# Patient Record
Sex: Male | Born: 1965 | Race: Black or African American | Hispanic: No | Marital: Single | Smoking: Current every day smoker
Health system: Southern US, Community
[De-identification: ages and names within clinical notes are randomized; demographics above are authoritative.]

## PROBLEM LIST (undated history)

## (undated) DIAGNOSIS — K259 Gastric ulcer, unspecified as acute or chronic, without hemorrhage or perforation: Secondary | ICD-10-CM

## (undated) DIAGNOSIS — F1010 Alcohol abuse, uncomplicated: Secondary | ICD-10-CM

---

## 1997-07-20 ENCOUNTER — Emergency Department (HOSPITAL_COMMUNITY): Admission: EM | Admit: 1997-07-20 | Discharge: 1997-07-20 | Payer: Self-pay

## 1997-09-19 ENCOUNTER — Emergency Department (HOSPITAL_COMMUNITY): Admission: EM | Admit: 1997-09-19 | Discharge: 1997-09-19 | Payer: Self-pay | Admitting: *Deleted

## 1997-10-06 ENCOUNTER — Emergency Department (HOSPITAL_COMMUNITY): Admission: EM | Admit: 1997-10-06 | Discharge: 1997-10-06 | Payer: Self-pay | Admitting: Emergency Medicine

## 1998-02-02 ENCOUNTER — Emergency Department (HOSPITAL_COMMUNITY): Admission: EM | Admit: 1998-02-02 | Discharge: 1998-02-02 | Payer: Self-pay | Admitting: Emergency Medicine

## 1998-04-07 ENCOUNTER — Encounter: Payer: Self-pay | Admitting: Emergency Medicine

## 1998-04-07 ENCOUNTER — Emergency Department (HOSPITAL_COMMUNITY): Admission: EM | Admit: 1998-04-07 | Discharge: 1998-04-07 | Payer: Self-pay | Admitting: Emergency Medicine

## 1998-05-19 ENCOUNTER — Emergency Department (HOSPITAL_COMMUNITY): Admission: EM | Admit: 1998-05-19 | Discharge: 1998-05-19 | Payer: Self-pay | Admitting: Emergency Medicine

## 1998-05-30 ENCOUNTER — Encounter: Admission: RE | Admit: 1998-05-30 | Discharge: 1998-05-30 | Payer: Self-pay | Admitting: Family Medicine

## 1998-06-21 ENCOUNTER — Encounter: Admission: RE | Admit: 1998-06-21 | Discharge: 1998-06-21 | Payer: Self-pay | Admitting: Sports Medicine

## 1998-08-12 ENCOUNTER — Encounter: Admission: RE | Admit: 1998-08-12 | Discharge: 1998-08-12 | Payer: Self-pay | Admitting: Family Medicine

## 1998-08-15 ENCOUNTER — Encounter: Admission: RE | Admit: 1998-08-15 | Discharge: 1998-09-02 | Payer: Self-pay

## 1998-09-21 ENCOUNTER — Encounter: Admission: RE | Admit: 1998-09-21 | Discharge: 1998-09-21 | Payer: Self-pay | Admitting: Family Medicine

## 1998-11-28 ENCOUNTER — Encounter: Admission: RE | Admit: 1998-11-28 | Discharge: 1998-11-28 | Payer: Self-pay | Admitting: Family Medicine

## 1999-03-14 ENCOUNTER — Emergency Department (HOSPITAL_COMMUNITY): Admission: EM | Admit: 1999-03-14 | Discharge: 1999-03-14 | Payer: Self-pay | Admitting: Emergency Medicine

## 1999-03-20 ENCOUNTER — Encounter: Admission: RE | Admit: 1999-03-20 | Discharge: 1999-03-20 | Payer: Self-pay | Admitting: Internal Medicine

## 1999-04-06 ENCOUNTER — Emergency Department (HOSPITAL_COMMUNITY): Admission: EM | Admit: 1999-04-06 | Discharge: 1999-04-06 | Payer: Self-pay | Admitting: Emergency Medicine

## 1999-08-09 ENCOUNTER — Encounter: Payer: Self-pay | Admitting: Emergency Medicine

## 1999-08-09 ENCOUNTER — Emergency Department (HOSPITAL_COMMUNITY): Admission: EM | Admit: 1999-08-09 | Discharge: 1999-08-09 | Payer: Self-pay | Admitting: Emergency Medicine

## 2002-08-08 ENCOUNTER — Emergency Department (HOSPITAL_COMMUNITY): Admission: EM | Admit: 2002-08-08 | Discharge: 2002-08-08 | Payer: Self-pay | Admitting: Emergency Medicine

## 2002-08-08 ENCOUNTER — Encounter: Payer: Self-pay | Admitting: Emergency Medicine

## 2003-06-01 ENCOUNTER — Inpatient Hospital Stay (HOSPITAL_COMMUNITY): Admission: EM | Admit: 2003-06-01 | Discharge: 2003-06-02 | Payer: Self-pay | Admitting: Emergency Medicine

## 2003-06-01 ENCOUNTER — Encounter: Payer: Self-pay | Admitting: Cardiology

## 2007-04-17 ENCOUNTER — Emergency Department (HOSPITAL_COMMUNITY): Admission: EM | Admit: 2007-04-17 | Discharge: 2007-04-17 | Payer: Self-pay | Admitting: Emergency Medicine

## 2007-06-03 ENCOUNTER — Emergency Department (HOSPITAL_COMMUNITY): Admission: EM | Admit: 2007-06-03 | Discharge: 2007-06-03 | Payer: Self-pay | Admitting: Emergency Medicine

## 2009-03-04 IMAGING — CR DG CHEST 2V
2 series · 2 of 2 positions shown · non-contrast
Comparison: 06/01/03.

CLINICAL DATA: Hypertension/chest pain.  
 CHEST ? 2 VIEW:

[w chest pa *]
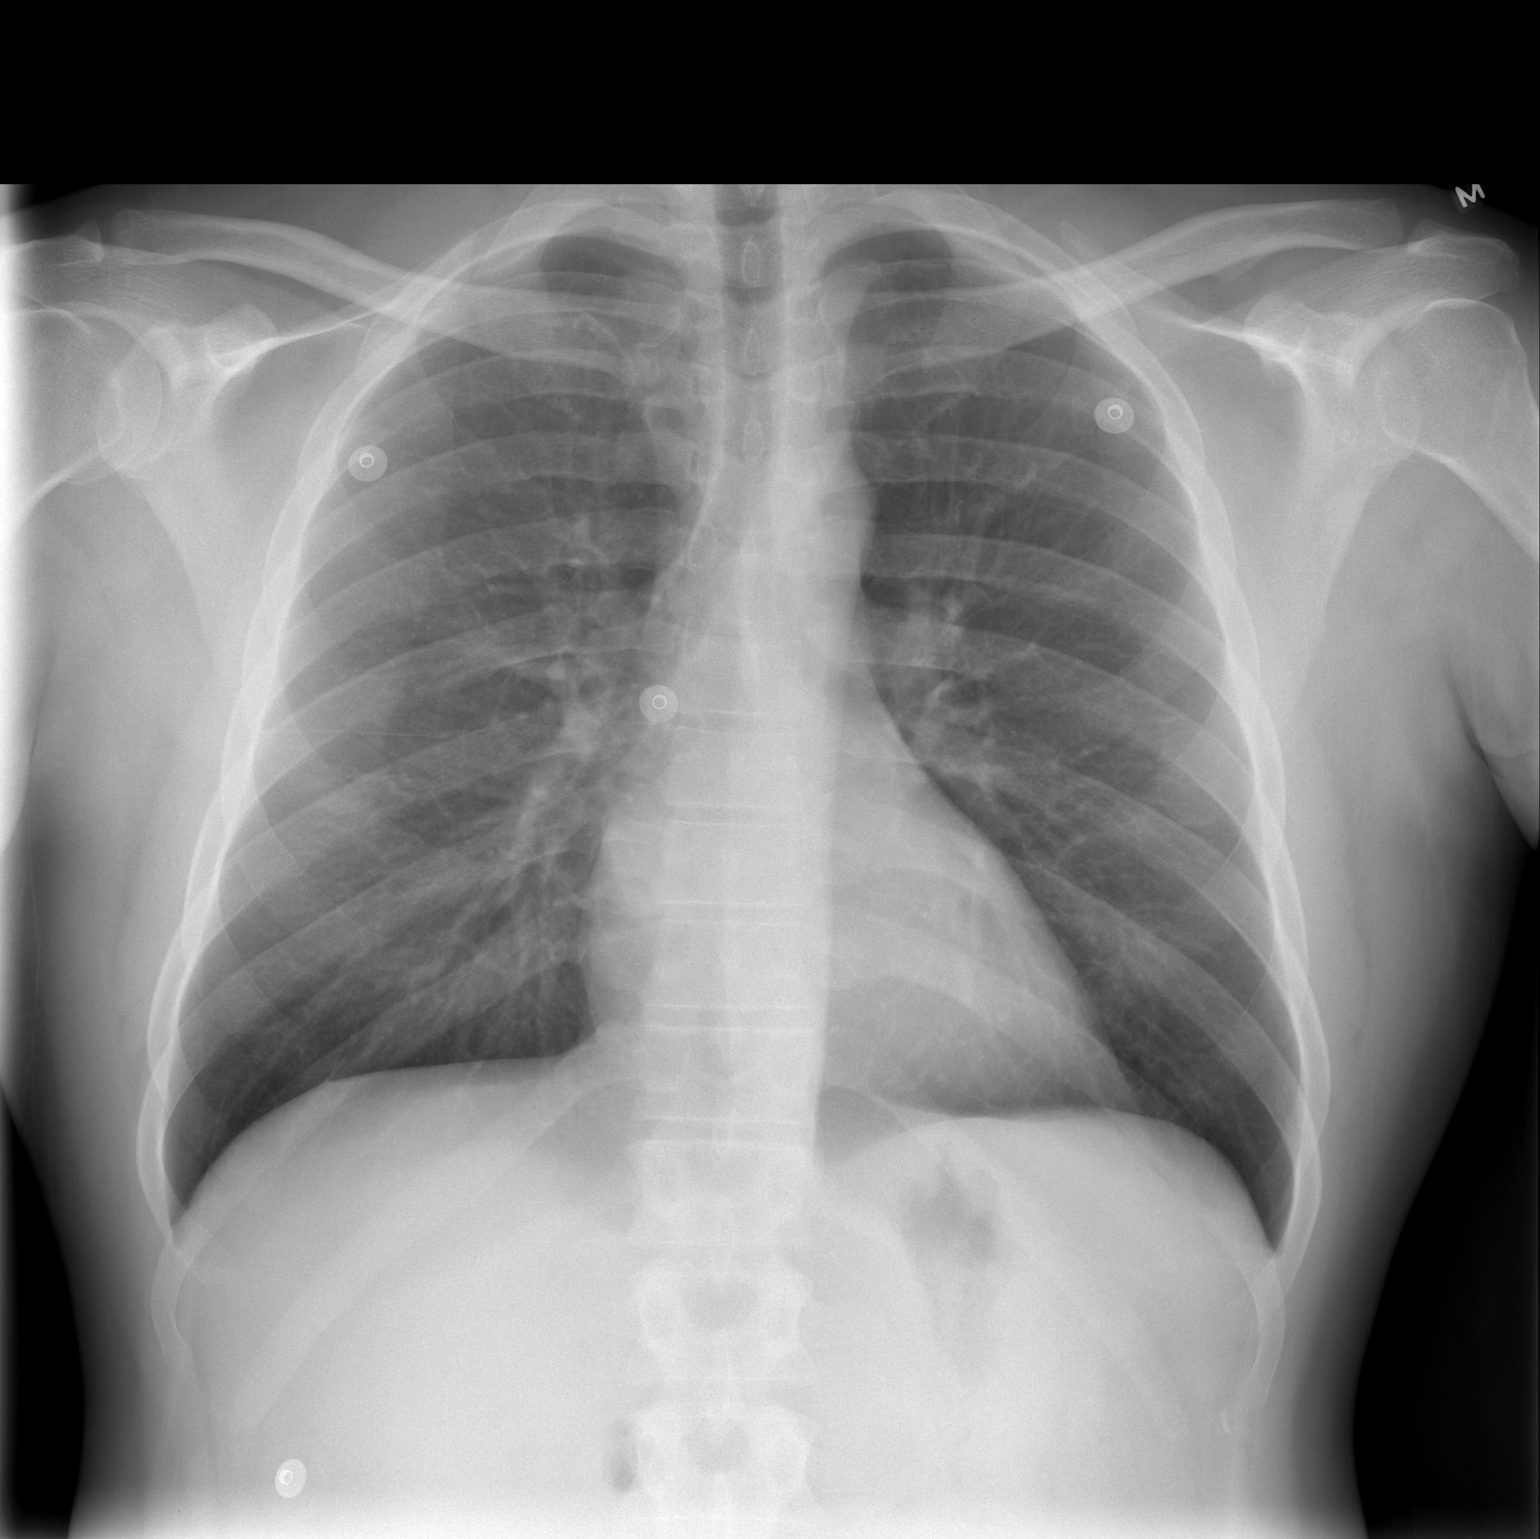

[w chest lat]
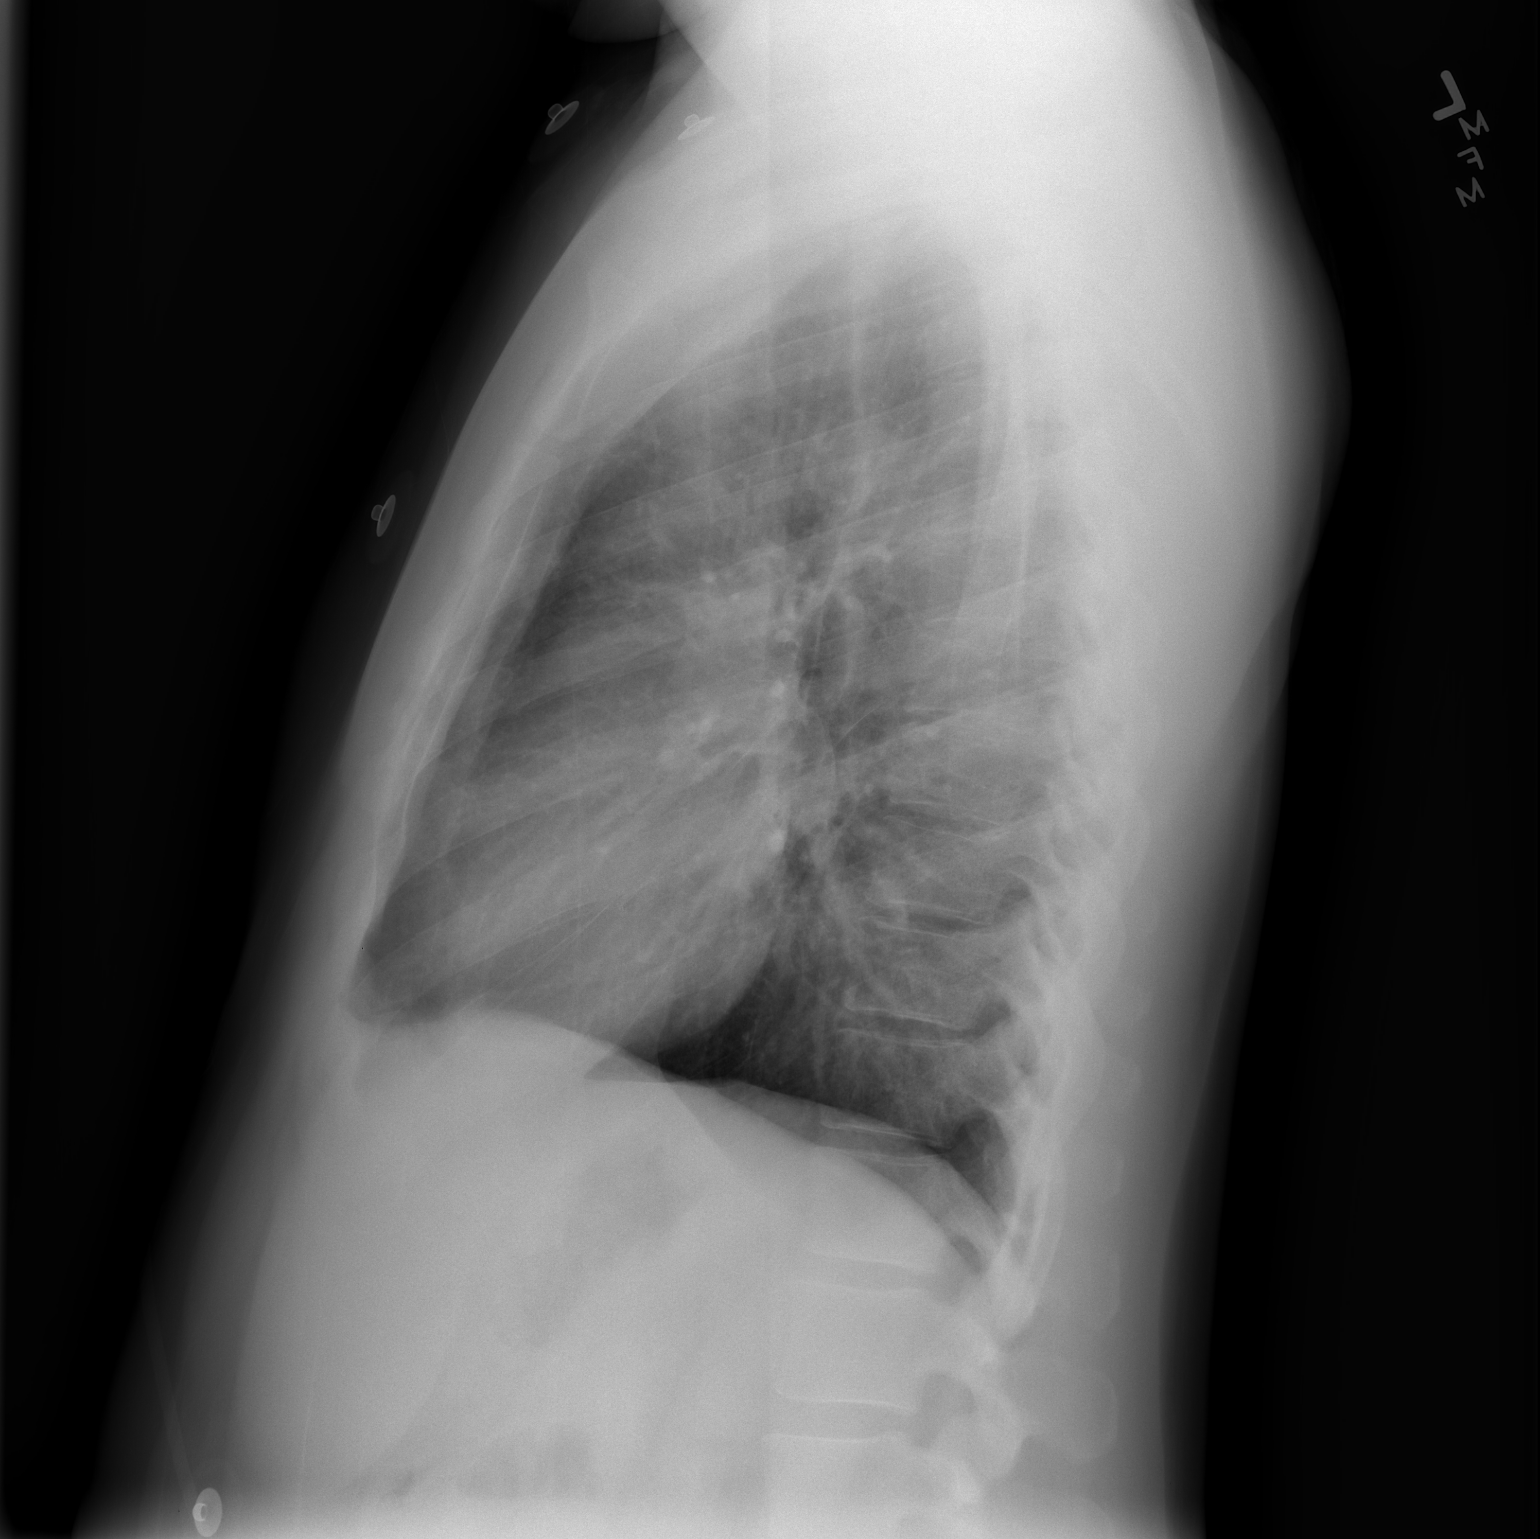

[2 of 2 positions shown; findings below may reference images not displayed]

FINDINGS: The heart size and mediastinal contours are within normal limits.  Both lungs are clear.  The visualized skeletal structures are unremarkable.
IMPRESSION: No active cardiopulmonary disease.

## 2009-12-27 ENCOUNTER — Ambulatory Visit: Payer: Self-pay | Admitting: Cardiology

## 2010-03-16 ENCOUNTER — Inpatient Hospital Stay (HOSPITAL_COMMUNITY): Admission: EM | Admit: 2010-03-16 | Discharge: 2009-12-29 | Payer: Self-pay | Admitting: Emergency Medicine

## 2010-06-22 LAB — CK TOTAL AND CKMB (NOT AT ARMC)
CK, MB: 14.2 ng/mL (ref 0.3–4.0)
Relative Index: 0.4 (ref 0.0–2.5)
Total CK: 3302 U/L — ABNORMAL HIGH (ref 7–232)

## 2010-06-22 LAB — POCT I-STAT, CHEM 8
BUN: 6 mg/dL (ref 6–23)
Calcium, Ion: 1.12 mmol/L (ref 1.12–1.32)
Glucose, Bld: 98 mg/dL (ref 70–99)
HCT: 53 % — ABNORMAL HIGH (ref 39.0–52.0)
TCO2: 24 mmol/L (ref 0–100)

## 2010-06-22 LAB — COMPREHENSIVE METABOLIC PANEL
ALT: 48 U/L (ref 0–53)
AST: 91 U/L — ABNORMAL HIGH (ref 0–37)
Alkaline Phosphatase: 53 U/L (ref 39–117)
CO2: 28 mEq/L (ref 19–32)
Chloride: 103 mEq/L (ref 96–112)
GFR calc Af Amer: >60 mL/min (ref >60)
GFR calc non Af Amer: >60 mL/min (ref >60)
Glucose, Bld: 96 mg/dL (ref 70–99)
Potassium: 3.5 mEq/L (ref 3.5–5.1)
Sodium: 138 mEq/L (ref 135–145)
Total Bilirubin: 0.8 mg/dL (ref 0.3–1.2)

## 2010-06-22 LAB — CARDIAC PANEL(CRET KIN+CKTOT+MB+TROPI)
Relative Index: 0.3 (ref 0.0–2.5)
Total CK: 3441 U/L — ABNORMAL HIGH (ref 7–232)
Total CK: 3623 U/L — ABNORMAL HIGH (ref 7–232)
Troponin I: 0.1 ng/mL — ABNORMAL HIGH (ref 0.00–0.06)

## 2010-06-22 LAB — CBC
MCHC: 35.8 g/dL (ref 30.0–36.0)
MCV: 95.4 fL (ref 78.0–100.0)
Platelets: 185 10*3/uL (ref 150–400)
RDW: 13.9 % (ref 11.5–15.5)
WBC: 11.1 10*3/uL — ABNORMAL HIGH (ref 4.0–10.5)

## 2010-06-22 LAB — DRUGS OF ABUSE SCREEN W/O ALC, ROUTINE URINE
Barbiturate Quant, Ur: NEGATIVE
Cocaine Metabolites: NEGATIVE
Creatinine,U: 98.6 mg/dL
Opiate Screen, Urine: NEGATIVE
Phencyclidine (PCP): NEGATIVE

## 2010-06-22 LAB — BASIC METABOLIC PANEL
BUN: 7 mg/dL (ref 6–23)
CO2: 28 mEq/L (ref 19–32)
Calcium: 9.1 mg/dL (ref 8.4–10.5)
Chloride: 104 mEq/L (ref 96–112)
Creatinine, Ser: 1.07 mg/dL (ref 0.4–1.5)
GFR calc Af Amer: >60 mL/min (ref >60)

## 2010-06-22 LAB — TROPONIN I: Troponin I: 0.19 ng/mL — ABNORMAL HIGH (ref 0.00–0.06)

## 2010-06-22 LAB — DIFFERENTIAL
Basophils Absolute: 0 10*3/uL (ref 0.0–0.1)
Basophils Relative: 0 % (ref 0–1)
Eosinophils Relative: 0 % (ref 0–5)
Lymphocytes Relative: 16 % (ref 12–46)

## 2010-06-22 LAB — POCT CARDIAC MARKERS
CKMB, poc: 6.5 ng/mL (ref 1.0–8.0)
Troponin i, poc: <0.05 ng/mL (ref 0.00–0.09)
Troponin i, poc: <0.05 ng/mL (ref 0.00–0.09)

## 2010-06-22 LAB — HEPARIN LEVEL (UNFRACTIONATED)
Heparin Unfractionated: 0.28 IU/mL — ABNORMAL LOW (ref 0.30–0.70)
Heparin Unfractionated: 0.32 IU/mL (ref 0.30–0.70)

## 2010-06-22 LAB — LIPID PANEL
Cholesterol: 198 mg/dL (ref 0–200)
HDL: 42 mg/dL (ref >39)

## 2010-08-25 NOTE — H&P (Signed)
NAME:  Jerry Bush, Jerry Bush                         ACCOUNT NO.:  [id number]   MEDICAL RECORD NO.:  [id number]                   PATIENT TYPE:  EMS   LOCATION:  [facility]                                 FACILITY:  [facility]   PHYSICIAN:  [place] Bing, M.D.               DATE OF BIRTH:  1965/06/02   DATE OF ADMISSION:  06/01/2003  DATE OF DISCHARGE:                                HISTORY & PHYSICAL   REFERRING PHYSICIAN:  Celene Kras, M.D.   PRIMARY CARE PHYSICIAN:  [facility].   HISTORY OF PRESENT ILLNESS:  A 45 year old gentleman without previous  cardiovascular history presenting with atrial fibrillation.  The patient has  enjoyed generally excellent health. He was told of borderline hypertension  in the past, but has not pursued treatment for same and has not been found  to have persistently elevated blood pressures.  His exercise tolerance is  generally good.  After taking a short walk this evening, he returned home  and noted dizziness, chest discomfort, and dyspnea.  He told his family to  call EMS and then went into another room and lay on the floor.  When a  family member came to check on him, he was found face down. He was turned  over and found to be drooling and poorly responsive.  Eyes were opened and  there was some movement, but he did not respond to questioning or to mildly  painful stimulation.  There was no seizure activity, there was no  incontinence.  He had two beers earlier, but denies any drug use.  There  were no other similar episodes of loss of consciousness.  He has no  psychiatric history.  EMS were summoned and vital signs were found to be  normal. He was transported to the emergency department where a CT scan of  his head was negative.  EKG demonstrated atrial fibrillation with a rapid  ventricular response.  No hypotension was documented.  His mental status  gradually returned to normal.   PAST MEDICAL HISTORY:  Entirely benign.   MEDICATIONS:  He takes no medications.   ALLERGIES:  No known drug allergies.   PAST SURGICAL HISTORY:  He has never had surgery nor been admitted to a  hospital.   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  He  lives with his sister.   FAMILY HISTORY:  Negative for atrial fibrillation.   REVIEW OF SYSTEMS:  Entirely negative.   PHYSICAL EXAMINATION:  GENERAL:  A pleasant gentleman in no acute distress.  VITAL SIGNS:  Heart rate is 140 and irregular, respirations 18, blood  pressure 120/70.  HEENT:  Anicteric sclerae; normal dentition.  NECK:  No jugular venous distention, no carotid bruits.  ENDOCRINE:  NO thyromegaly.  HEMATOPOIETIC:  No adenopathy.  SKIN:  No significant lesions.  LUNGS:  Clear.  HEART:  Irregular rhythm, normal first and second heart  sounds.  ABDOMEN:  Soft and nontender.  No organomegaly.  Normal bowel sounds.  EXTREMITIES:  Normal distal pulses, no edema.  NEUROMUSCULAR:  Symmetric strength and tone.  Normal mental status.   EKG; atrial fibrillation with rapid ventricular response. Prominent voltage.  Otherwise unremarkable.   LABORATORY DATA:  Alcohol level of 92.   IMPRESSION:  The patient presents with apparently new onset of atrial  fibrillation.  He has no known structural heart disease.  The duration of  arrhythmia is probably a matter of hours.  Rate will be controlled with  Diltiazem and beta blockers.  Anticoagulation will be deferred for now.  If  onset is in fact recent, the likelihood of conversion spontaneously within  the next 24 hours is high.   The etiology of transient impairment of mental status is unclear. A drug  screen will be obtained.  There was no apparent hemodynamic issue that would  lead to cerebral hypoperfusion.  Thromboembolism is unlikely.  Pulmonary  embolism is a consideration.  Appropriate blood tests will be obtained and a  CT scan of the chest if necessary.  A conversion reaction to his  acute  illness is possible, but he has not had a history of similar episodes.   He will be admitted at least overnight.  Serial cardiac markers and EKG's  will be obtained.  If he does not convert within 24 hours, consideration  will be given to anticoagulation and either TEE-guided cardioversion or  delayed cardioversion after a period of anticoagulation.                                                [place] Bing, M.D.    RR/MEDQ  D:  06/01/2003  T:  06/01/2003  Job:  [id number]

## 2010-08-25 NOTE — Discharge Summary (Signed)
NAME:  Jerry Bush, Jerry Bush                         ACCOUNT NO.:  [id number]   MEDICAL RECORD NO.:  [id number]                   PATIENT TYPE:  INP   LOCATION:  2014                                 FACILITY:  [facility]   PHYSICIAN:   Bing, M.D.               DATE OF BIRTH:  10-09-1965   DATE OF ADMISSION:  06/01/2003  DATE OF DISCHARGE:                                 DISCHARGE SUMMARY   PROCEDURES:  2D echocardiogram.   HOSPITAL COURSE:  The patient is a 45 year old male with no known history of  coronary artery disease.  He came to the hospital for slight chest  discomfort as well as dizziness, dyspnea, and a slight decline in mental  status.  He was brought to the emergency room and was found to be in atrial  fibrillation with a rate of approximately 140.  His ETOH level was 0.9 and  his drug screen was negative.  He was admitted for further evaluation and  treatment.   He was started on IV Cardizem and converted to normal sinus rhythm.  He had  a beta blocker added to his medication regimen and this was titrated to  atenolol 50 mg daily.  He tolerated this well and had no hypotension or  dizziness.  He maintained sinus rhythm once he converted.  His enzymes were  negative for MI and he had a 2D echocardiogram performed which showed an EF  of 55 to 65% with normal systolic function and no critical valvular  abnormalities.  There was no effusion and the pericardium was normal in  appearance.  Dr. 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  He had an aspirin added to his medication regimen,  but it was felt that he did not need full anticoagulation with Coumadin at  this time.   Dr. Andee Lineman evaluated the patient and felt that an outpatient Cardiolite was  adequate for evaluation of ischemia.  His chest pain resolved once he  converted to sinus rhythm and he had no further episodes.  He was ambulating  without chest pain or shortness of  breath and considered stable for  discharge on 06/02/03.  Of note, he had a mildly elevated blood glucose at  130, and because of this, a hemoglobin A1c was performed.  This laboratory  value is pending at the time of dictation.   CONDITION ON DISCHARGE:  Improved.   DISCHARGE DIAGNOSES:  1. Paroxysmal atrial fibrillation (question Holiday Heart).  2. Blood alcohol level of 0.9 upon admission.  3. Preserved left ventricular function with no significant valvular wall     motion abnormalities.   DISCHARGE INSTRUCTIONS:  His activity level is to be as tolerated and he can  return to work on 06/03/03.  He is scheduled for stress test in the office  on March 8th at 8:30 a.m. and to follow up with the PA  for Dr. Andee Lineman on  March 15th at 10:30 a.m.   MEDICATIONS:  1. Aspirin 325 mg daily.  2. Atenolol 50 mg per day.   He is to follow up with his primary care physician as needed.      Theodore Demark, P.A. [facility]                  [place] Bing, M.D.    RB/MEDQ  D:  06/02/2003  T:  06/03/2003  Job:  [id number]   cc:   Learta Codding, M.D.   [facility]

## 2010-12-27 LAB — WOUND CULTURE: Gram Stain: NONE SEEN

## 2010-12-29 LAB — HEPATIC FUNCTION PANEL
ALT: 34
Albumin: 4.3
Alkaline Phosphatase: 55
Indirect Bilirubin: 0.8
Total Protein: 7.7

## 2010-12-29 LAB — POCT CARDIAC MARKERS
CKMB, poc: 1
CKMB, poc: <1 — ABNORMAL LOW
CKMB, poc: <1 — ABNORMAL LOW
Operator id: 3067
Operator id: 4708
Troponin i, poc: <0.05
Troponin i, poc: <0.05
Troponin i, poc: <0.05

## 2010-12-29 LAB — BASIC METABOLIC PANEL
BUN: 4 — ABNORMAL LOW
CO2: 27
Chloride: 101
Glucose, Bld: 112 — ABNORMAL HIGH
Potassium: 3.7
Sodium: 138

## 2010-12-29 LAB — CBC
HCT: 42
Hemoglobin: 14.9
MCHC: 35.3
MCV: 94.2
Platelets: 206
RDW: 14

## 2010-12-29 LAB — DIFFERENTIAL
Basophils Absolute: 0
Basophils Relative: 0
Eosinophils Absolute: 0
Eosinophils Relative: 1
Lymphocytes Relative: 19
Monocytes Absolute: 0.8

## 2010-12-29 LAB — RAPID URINE DRUG SCREEN, HOSP PERFORMED
Barbiturates: NOT DETECTED
Opiates: NOT DETECTED

## 2010-12-29 LAB — LIPASE, BLOOD: Lipase: 22

## 2011-09-27 IMAGING — CR DG CHEST 2V
2 series · 2 of 2 positions shown · non-contrast
Comparison: 06/03/2007

CLINICAL DATA: Shortness of breath, cough

CHEST - 2 VIEW

[view not recorded (1 of 2)]
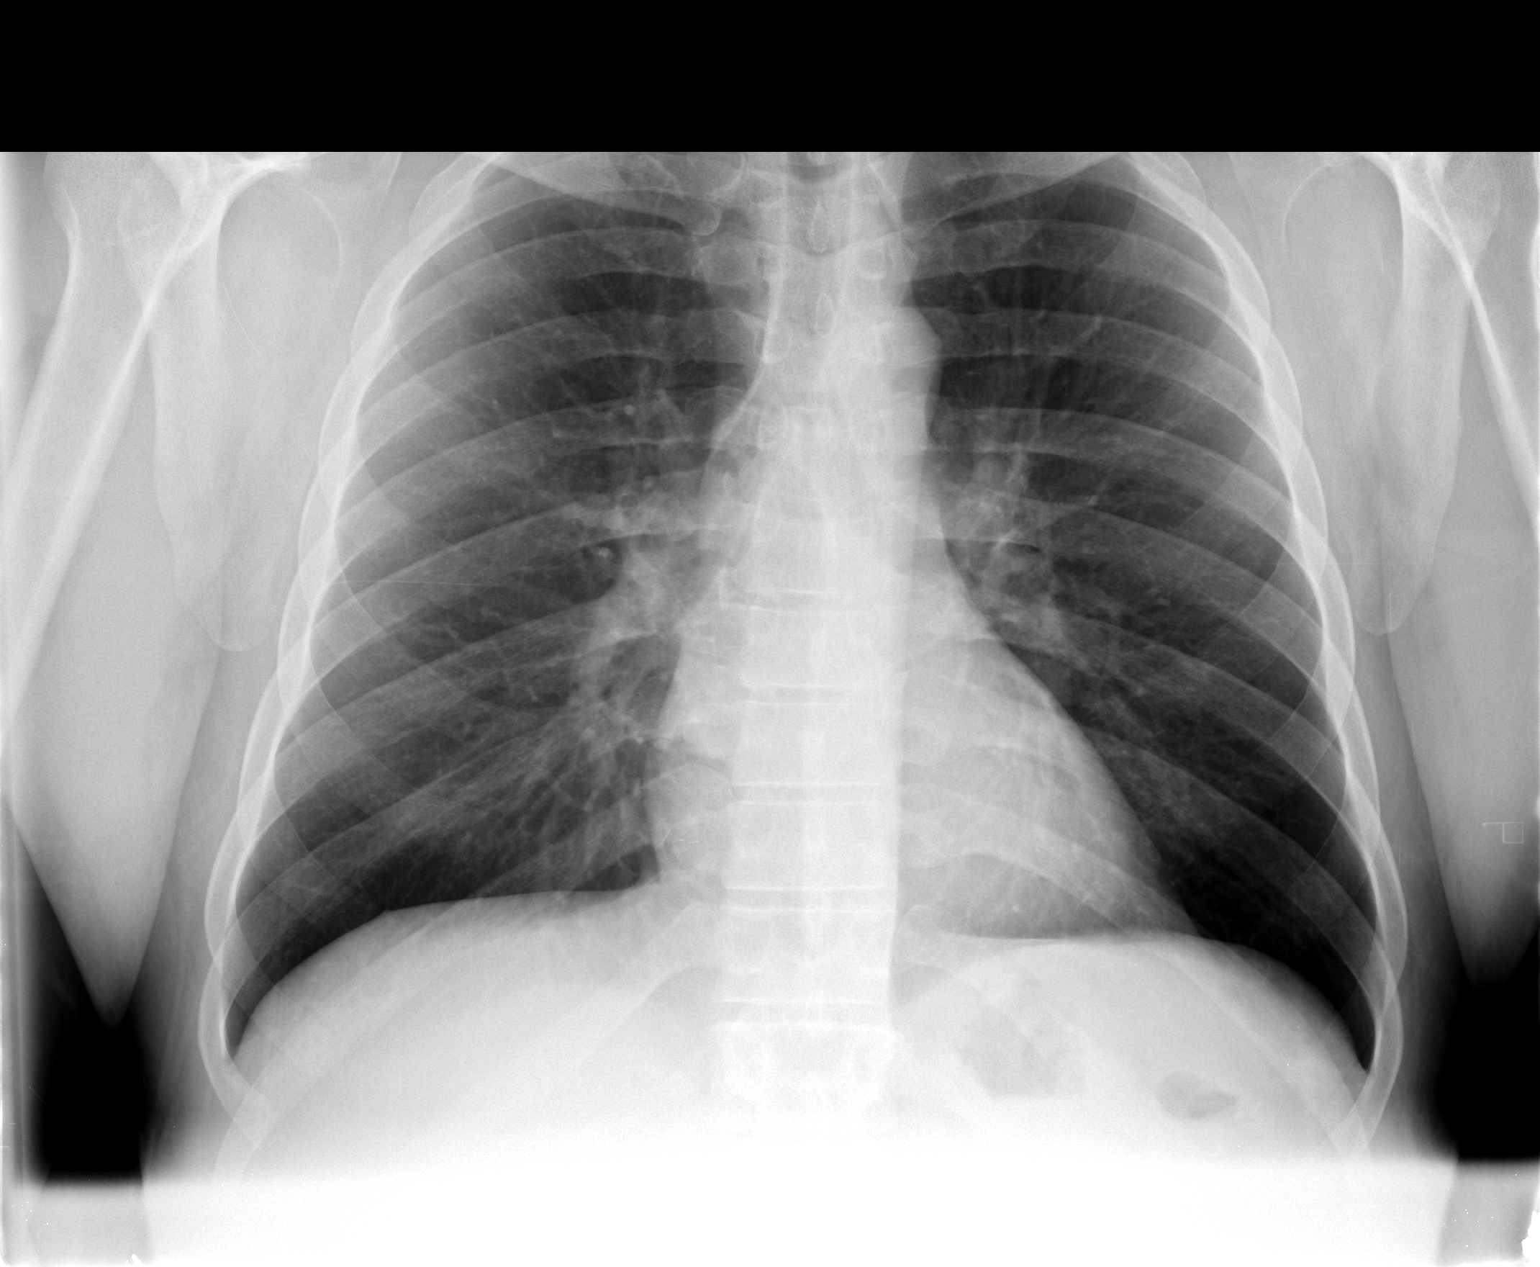

[view not recorded (2 of 2)]
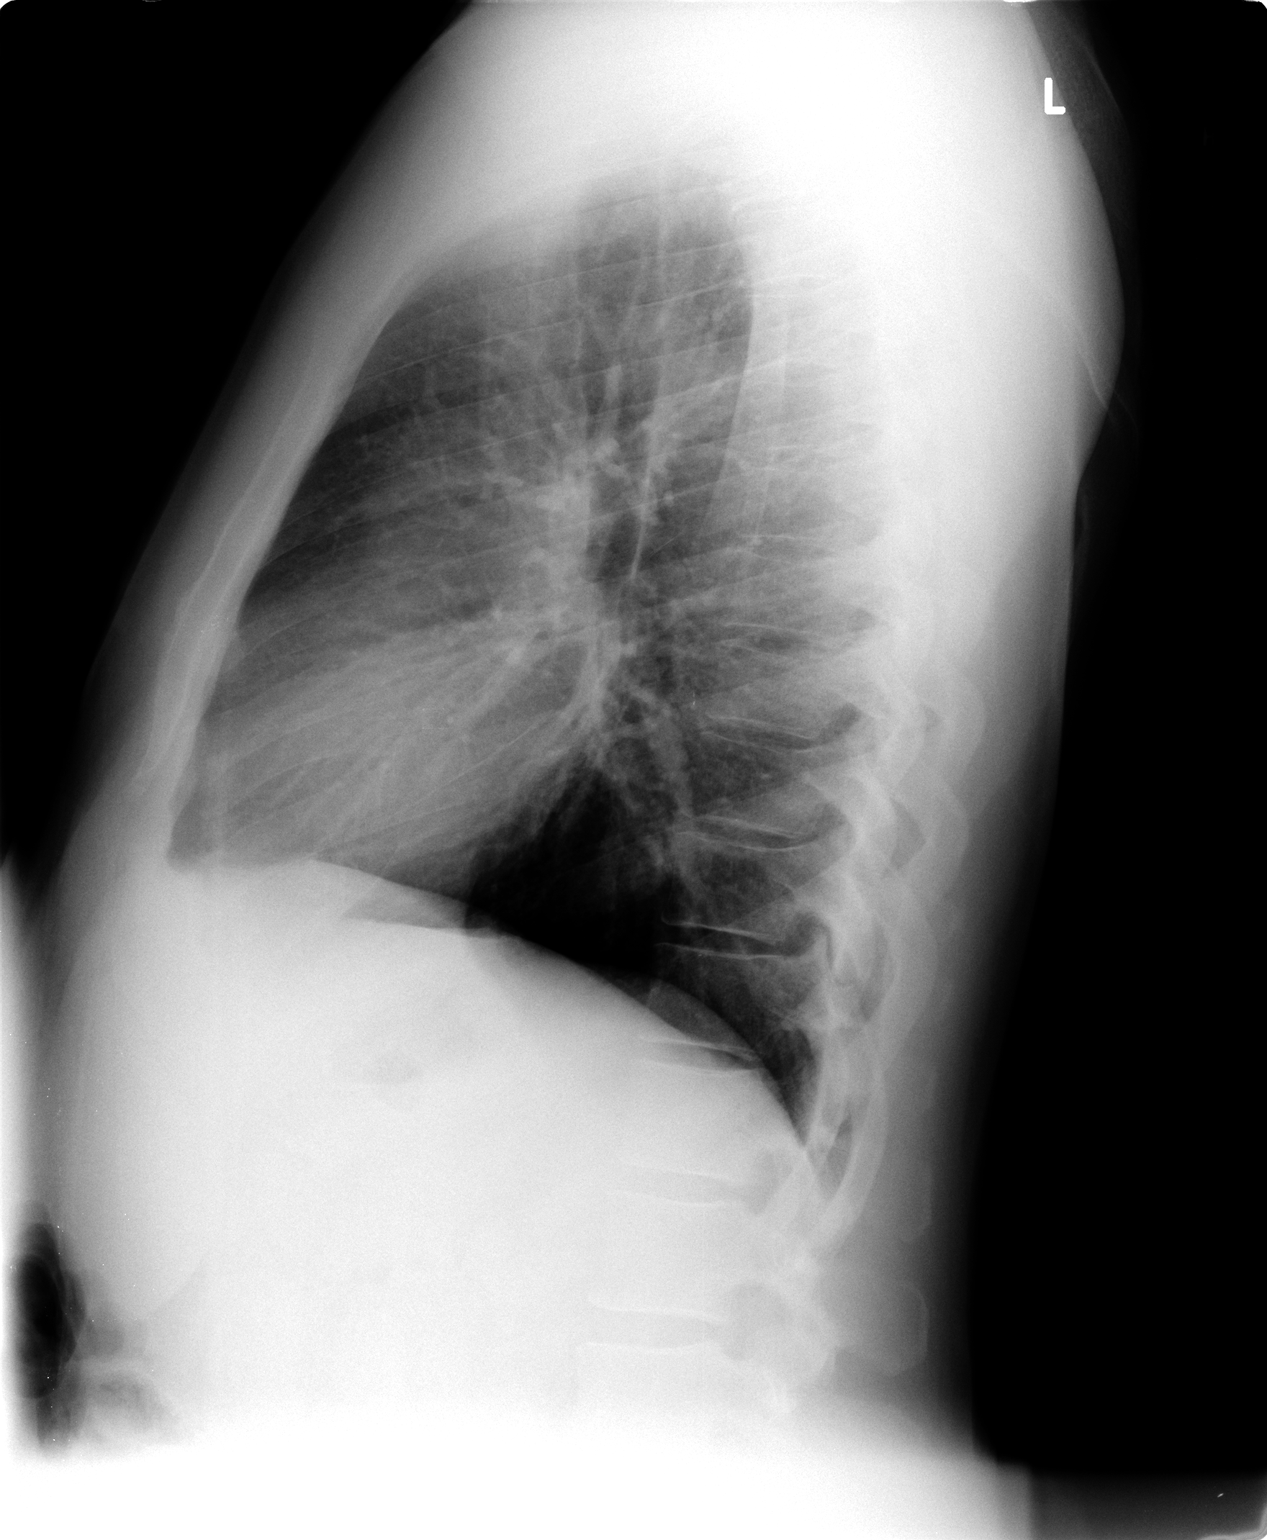

[2 of 2 positions shown; findings below may reference images not displayed]

FINDINGS: The cardiomediastinal silhouette is unremarkable.
The lungs are clear.
There is no evidence of focal airspace disease, pulmonary edema,
pleural effusion, or pneumothorax.
No acute bony abnormalities are identified.
IMPRESSION: No evidence of active cardiopulmonary disease.

## 2012-06-15 ENCOUNTER — Emergency Department (HOSPITAL_COMMUNITY)
Admission: EM | Admit: 2012-06-15 | Discharge: 2012-06-16 | Disposition: A | Discharge status: Home / Self Care | Payer: Self-pay | Attending: Emergency Medicine | Admitting: Emergency Medicine

## 2012-06-15 ENCOUNTER — Encounter (HOSPITAL_COMMUNITY): Payer: Self-pay | Admitting: Emergency Medicine

## 2012-06-15 DIAGNOSIS — Z8719 Personal history of other diseases of the digestive system: Secondary | ICD-10-CM | POA: Insufficient documentation

## 2012-06-15 DIAGNOSIS — F17200 Nicotine dependence, unspecified, uncomplicated: Secondary | ICD-10-CM | POA: Insufficient documentation

## 2012-06-15 DIAGNOSIS — F1010 Alcohol abuse, uncomplicated: Secondary | ICD-10-CM | POA: Insufficient documentation

## 2012-06-15 HISTORY — DX: Alcohol abuse, uncomplicated: F10.10

## 2012-06-15 HISTORY — DX: Gastric ulcer, unspecified as acute or chronic, without hemorrhage or perforation: K25.9

## 2012-06-15 LAB — CBC
HCT: 42.2 % (ref 39.0–52.0)
Hemoglobin: 14.7 g/dL (ref 13.0–17.0)
MCHC: 34.8 g/dL (ref 30.0–36.0)
RBC: 4.3 MIL/uL (ref 4.22–5.81)

## 2012-06-15 LAB — COMPREHENSIVE METABOLIC PANEL
ALT: 20 U/L (ref 0–53)
Alkaline Phosphatase: 72 U/L (ref 39–117)
BUN: 9 mg/dL (ref 6–23)
CO2: 27 mEq/L (ref 19–32)
GFR calc Af Amer: >90 mL/min (ref >90)
GFR calc non Af Amer: >90 mL/min (ref >90)
Glucose, Bld: 97 mg/dL (ref 70–99)
Potassium: 3.8 mEq/L (ref 3.5–5.1)
Sodium: 137 mEq/L (ref 135–145)
Total Bilirubin: 0.5 mg/dL (ref 0.3–1.2)

## 2012-06-15 LAB — RAPID URINE DRUG SCREEN, HOSP PERFORMED: Barbiturates: NOT DETECTED

## 2012-06-15 LAB — ETHANOL: Alcohol, Ethyl (B): <11 mg/dL (ref 0–11)

## 2012-06-15 MED ORDER — ONDANSETRON HCL 4 MG PO TABS: 4.0000 mg | ORAL_TABLET | Freq: Three times a day (TID) | ORAL | Status: DC | PRN

## 2012-06-15 MED ORDER — IBUPROFEN 600 MG PO TABS: 600.0000 mg | ORAL_TABLET | Freq: Three times a day (TID) | ORAL | Status: DC | PRN

## 2012-06-15 MED ORDER — FOLIC ACID 1 MG PO TABS
1.0000 mg | ORAL_TABLET | Freq: Every day | ORAL | Status: DC
  Administered 2012-06-15: 1 mg via ORAL
  Filled 2012-06-15: qty 1

## 2012-06-15 MED ORDER — THIAMINE HCL 100 MG/ML IJ SOLN: 100.0000 mg | Freq: Every day | INTRAMUSCULAR | Status: DC

## 2012-06-15 MED ORDER — LORAZEPAM 1 MG PO TABS
0.0000 mg | ORAL_TABLET | Freq: Four times a day (QID) | ORAL | Status: DC
  Administered 2012-06-16: 1 mg via ORAL
  Filled 2012-06-15: qty 1

## 2012-06-15 MED ORDER — LORAZEPAM 1 MG PO TABS: 1.0000 mg | ORAL_TABLET | Freq: Four times a day (QID) | ORAL | Status: DC | PRN

## 2012-06-15 MED ORDER — ADULT MULTIVITAMIN W/MINERALS CH
1.0000 | ORAL_TABLET | Freq: Every day | ORAL | Status: DC
  Administered 2012-06-15: 1 via ORAL
  Filled 2012-06-15: qty 1

## 2012-06-15 MED ORDER — LORAZEPAM 2 MG/ML IJ SOLN: 1.0000 mg | Freq: Four times a day (QID) | INTRAMUSCULAR | Status: DC | PRN

## 2012-06-15 MED ORDER — LORAZEPAM 1 MG PO TABS: 0.0000 mg | ORAL_TABLET | Freq: Two times a day (BID) | ORAL | Status: DC

## 2012-06-15 MED ORDER — NICOTINE 21 MG/24HR TD PT24
21.0000 mg | MEDICATED_PATCH | Freq: Every day | TRANSDERMAL | Status: DC
  Administered 2012-06-15: 21 mg via TRANSDERMAL
  Filled 2012-06-15: qty 1

## 2012-06-15 MED ORDER — VITAMIN B-1 100 MG PO TABS
100.0000 mg | ORAL_TABLET | Freq: Every day | ORAL | Status: DC
  Administered 2012-06-15: 100 mg via ORAL
  Filled 2012-06-15: qty 1

## 2012-06-15 NOTE — ED Notes (Signed)
Presents for alc. Detox, last drink 10pm last night.  Denies SI or HI, no AV hallucinations.  Denies feeling hopeless.  Pt calm & cooperative at present.

## 2012-06-15 NOTE — ED Provider Notes (Signed)
History     CSN: [id number]  Arrival date & time 06/15/12  1658   First MD Initiated Contact with Patient 06/15/12 1829      Chief Complaint  Patient presents with  . Alcohol Problem    (Consider location/radiation/quality/duration/timing/severity/associated sxs/prior treatment) HPI Comments: Patient presented for alcohol detox. His has been drinking for about 25 years. Drinks 3 24 ounce beers daily. Last drink was last at 10 PM. Denies having any withdrawal symptoms denies feeling shaky denies any history of seizures. Denies illicit drug use. He feels ready to quit at this point. He has never been sober before. Denies any SI or HI.  The history is provided by the patient.    Past Medical History  Diagnosis Date  . Alcohol abuse   . Stomach ulcer     History reviewed. No pertinent past surgical history.  History reviewed. No pertinent family history.  History  Substance Use Topics  . Smoking status: Current Every Day Smoker -- 1.00 packs/day    Types: Cigarettes  . Smokeless tobacco: Not on file  . Alcohol Use: Yes     Comment: 2-3 beers a day      Review of Systems  Constitutional: Negative for activity change and appetite change.  HENT: Negative for congestion and rhinorrhea.   Respiratory: Negative for cough, chest tightness and shortness of breath.   Cardiovascular: Negative for chest pain.  Gastrointestinal: Negative for nausea, vomiting and abdominal pain.  Genitourinary: Negative for testicular pain.  Musculoskeletal: Negative for back pain.  Skin: Negative for rash.  Neurological: Negative for dizziness, weakness and headaches.  Hematological: Negative for adenopathy.  Psychiatric/Behavioral: Negative for suicidal ideas and sleep disturbance.  A complete 10 system review of systems was obtained and all systems are negative except as noted in the HPI and PMH.    Allergies  Review of patient's allergies indicates no known allergies.  Home Medications   No current outpatient prescriptions on file.  BP 129/96  Pulse 120  Temp(Src) 98.9 F (37.2 C) (Other (Comment))  Resp 20  SpO2 99%  Physical Exam  Constitutional: He is oriented to person, place, and time. He appears well-developed and well-nourished. No distress.  HENT:  Head: Normocephalic.  Mouth/Throat: Oropharynx is clear and moist. No oropharyngeal exudate.  Eyes: Conjunctivae and EOM are normal. Pupils are equal, round, and reactive to light.  Neck: Normal range of motion. Neck supple.  Cardiovascular: Normal rate, regular rhythm and normal heart sounds.   No murmur heard. Pulmonary/Chest: Effort normal and breath sounds normal. No respiratory distress.  Abdominal: There is no tenderness. There is no rebound and no guarding.  Musculoskeletal: Normal range of motion. He exhibits no edema and no tenderness.  Neurological: He is alert and oriented to person, place, and time. No cranial nerve deficit. He exhibits normal muscle tone. Coordination normal.  Skin: Skin is warm.    ED Course  Procedures (including critical care time)  Labs Reviewed  CBC - Abnormal; Notable for the following:    MCH 34.2 (*)    All other components within normal limits  COMPREHENSIVE METABOLIC PANEL - Abnormal; Notable for the following:    Total Protein 8.6 (*)    All other components within normal limits  URINE RAPID DRUG SCREEN (HOSP PERFORMED) - Abnormal; Notable for the following:    Tetrahydrocannabinol POSITIVE (*)    All other components within normal limits  ETHANOL   No results found.   No diagnosis found.    MDM  Alcohol abuse requesting detox. No history of withdrawal symptoms. No history of seizures. Vital stable, no distress. No SI or HI.  Screening labs, alcohol withdrawal protocol, discuss with act team for placement.       Glynn Octave, MD 06/15/12 [phone]

## 2012-06-15 NOTE — ED Notes (Signed)
Pt states he has been drinking for about 20 years.  States that he drinks about 2-3 beers a day.  Last drink was last night at 10 pm.  States that he has never been sober.

## 2012-06-15 NOTE — ED Notes (Signed)
Pt's personal belongings have been taken home by pt's mother.

## 2012-06-16 NOTE — ED Provider Notes (Signed)
Patient initially presented for voluntary detox from alcohol. Plan is now for outpatient treatment. Patient has resources provided. He is not homicidal or suicidal. Patient discharged.  Gilda Crease, MD 06/16/12 [phone]

## 2012-06-16 NOTE — BH Assessment (Signed)
Assessment Note   Jerry Bush is an 47 y.o. male. Pt presents voluntarily to [facility] with request for detox from alcohol. Pt denies SI and HI. He denies [facility] and no delusions noted. Pt has never been to detox or treatment before. He says he wants to quit drinking b/c he is developing health problems including ulcers. He has no hx of seizures. Pt reports drinking six 12 oz beers daily for past 20 yrs. He drank a 24 oz beer at 11 pm on 06/14/12. Pt denies withdrawal symptoms currently. Pt's UDS was positive for THC but pt states he doesn't smoke thc but is often with people who do smoke it. Pt has worked 4 yrs at [employer] and reports this is a source of stress. He lives w/ his 39 yo stepson and stepson's g/f. He says he has been arrested six times and once was for DUI in 1992. Pt will be [facility] to [facility].  Axis I: Alcohol Abuse Axis II: Deferred Axis III:  Past Medical History  Diagnosis Date  . Alcohol abuse   . Stomach ulcer    Axis IV: other psychosocial or environmental problems and problems related to social environment Axis V: 41-50 serious symptoms  Past Medical History:  Past Medical History  Diagnosis Date  . Alcohol abuse   . Stomach ulcer     History reviewed. No pertinent past surgical history.  Family History: History reviewed. No pertinent family history.  Social History:  reports that he has been smoking Cigarettes.  He has been smoking about 1.00 pack per day. He does not have any smokeless tobacco history on file. He reports that  drinks alcohol. He reports that he does not use illicit drugs.  Additional Social History:  Alcohol / Drug Use Pain Medications: none Prescriptions: none Over the Counter: none History of alcohol / drug use?: Yes Longest period of sobriety (when/how long): no period of sobriety Negative Consequences of Use: Legal Withdrawal Symptoms:  (pt denies withdrawal sxs) Substance #1 Name of Substance 1: alcohol 1 - Age of First Use: 20 1 -  Amount (size/oz): six 12 oz beers 1 - Frequency: daily 1 - Duration: 20 yrs 1 - Last Use / Amount: 06/14/12 - 24 oz beer  CIWA: CIWA-Ar BP: 129/96 mmHg Pulse Rate: 120 Nausea and Vomiting: no nausea and no vomiting Tactile Disturbances: none Tremor: no tremor Auditory Disturbances: not present Paroxysmal Sweats: no sweat visible Visual Disturbances: not present Anxiety: no anxiety, at ease Headache, Fullness in Head: none present Agitation: normal activity Orientation and Clouding of Sensorium: oriented and can do serial additions CIWA-Ar Total: 0 COWS:    Allergies: No Known Allergies  Home Medications:  (Not in a hospital admission)  OB/GYN Status:  No LMP for male patient.  General Assessment Data Location of Assessment: [facility] ED Living Arrangements: Children Can pt return to current living arrangement?: Yes Admission Status: Voluntary Is patient capable of signing voluntary admission?: Yes Transfer from: Acute Hospital Referral Source: Self/Family/Friend  Education Status Is patient currently in school?: No Current Grade: na Highest grade of school patient has completed: 45 Name of school: [employer]  Risk to self Suicidal Ideation: No Suicidal Intent: No Is patient at risk for suicide?: No Suicidal Plan?: No Access to Means: No What has been your use of drugs/alcohol within the last 12 months?: daily alcohol use Previous Attempts/Gestures: No How many times?: 0 Other Self Harm Risks: none Triggers for Past Attempts:  (none) Intentional Self Injurious Behavior: None  Family Suicide History: No Recent stressful life event(s): Other (Comment) (job at [employer]) Persecutory voices/beliefs?: No Depression: No Substance abuse history and/or treatment for substance abuse?: Yes Suicide prevention information given to non-admitted patients: Not applicable  Risk to Others Homicidal Ideation: No Thoughts of Harm to Others: No Current Homicidal  Intent: No Current Homicidal Plan: No Access to Homicidal Means: No Identified Victim: none History of harm to others?: No Assessment of Violence: None Noted Violent Behavior Description: pt calm and cooperative - denies hx of violence Does patient have access to weapons?: No Criminal Charges Pending?: No Does patient have a court date: No  Psychosis Hallucinations: None noted Delusions: None noted  Mental Status Report Appear/Hygiene: Other (Comment) (unremarkable) Eye Contact: Good Motor Activity: Freedom of movement Speech: Logical/coherent Level of Consciousness: Alert Mood: Other (Comment) ("stressed out") Affect: Other (Comment) (euthymic) Anxiety Level: Minimal Thought Processes: Relevant;Coherent Judgement: Unimpaired Orientation: Place;Person;Situation;Time Obsessive Compulsive Thoughts/Behaviors: None  Cognitive Functioning Concentration: Normal Memory: Recent Intact;Remote Intact IQ: Average Insight: Fair Impulse Control: Poor Appetite: Poor Weight Loss: 12 (in 2 mos) Weight Gain: 0 Sleep: No Change Total Hours of Sleep: 5 Vegetative Symptoms: None  ADLScreening [facility] Assessment Services) Patient's cognitive ability adequate to safely complete daily activities?: Yes Patient able to express need for assistance with ADLs?: Yes Independently performs ADLs?: Yes (appropriate for developmental age)  Abuse/Neglect [facility]) Physical Abuse: Denies Verbal Abuse: Denies Sexual Abuse: Denies  Prior Inpatient Therapy Prior Inpatient Therapy: No Prior Therapy Dates: na Prior Therapy Facilty/Provider(s): na Reason for Treatment: na  Prior Outpatient Therapy Prior Outpatient Therapy: No Prior Therapy Dates: na Prior Therapy Facilty/Provider(s): na Reason for Treatment: na  ADL Screening (condition at time of admission) Patient's cognitive ability adequate to safely complete daily activities?: Yes Patient able to express need for assistance with ADLs?:  Yes Independently performs ADLs?: Yes (appropriate for developmental age) Weakness of Legs: None Weakness of Arms/Hands: None  Home Assistive Devices/Equipment Home Assistive Devices/Equipment: None    Abuse/Neglect Assessment (Assessment to be complete while patient is alone) Physical Abuse: Denies Verbal Abuse: Denies Sexual Abuse: Denies Exploitation of patient/patient's resources: Denies Self-Neglect: Denies Values / Beliefs Cultural Requests During Hospitalization: None Spiritual Requests During Hospitalization: None   Advance Directives (For Healthcare) Advance Directive: Patient does not have advance directive    Additional Information 1:1 In Past 12 Months?: No CIRT Risk: No Elopement Risk: No Does patient have medical clearance?: Yes     Disposition:  Disposition Initial Assessment Completed: Yes Disposition of Patient: Other dispositions;Referred to Other disposition(s): Information only Patient referred to: [facility]  On Site Evaluation by:   Reviewed with Physician:     Donnamarie Rossetti P 06/16/2012 12:07 AM

## 2012-06-16 NOTE — BHH Counselor (Signed)
Patient does not meet criteria for inpatient detox. Patient discharged home to follow up with [facility] or [facility]. Writer provided patient with referrals to each facility along with addresses and phone numbers.  EDP-Dr. Blinda Leatherwood notified and will discharge patient accordingly.

## 2016-10-23 ENCOUNTER — Emergency Department (HOSPITAL_COMMUNITY): Payer: Self-pay

## 2016-10-23 ENCOUNTER — Emergency Department (HOSPITAL_COMMUNITY)
Admission: EM | Admit: 2016-10-23 | Discharge: 2016-10-24 | Disposition: A | Discharge status: Home / Self Care | Payer: Self-pay | Attending: Emergency Medicine | Admitting: Emergency Medicine

## 2016-10-23 ENCOUNTER — Encounter (HOSPITAL_COMMUNITY): Payer: Self-pay | Admitting: Emergency Medicine

## 2016-10-23 DIAGNOSIS — S01411A Laceration without foreign body of right cheek and temporomandibular area, initial encounter: Secondary | ICD-10-CM | POA: Insufficient documentation

## 2016-10-23 DIAGNOSIS — Z23 Encounter for immunization: Secondary | ICD-10-CM | POA: Insufficient documentation

## 2016-10-23 DIAGNOSIS — Y9389 Activity, other specified: Secondary | ICD-10-CM | POA: Insufficient documentation

## 2016-10-23 DIAGNOSIS — F17210 Nicotine dependence, cigarettes, uncomplicated: Secondary | ICD-10-CM | POA: Insufficient documentation

## 2016-10-23 DIAGNOSIS — Y998 Other external cause status: Secondary | ICD-10-CM | POA: Insufficient documentation

## 2016-10-23 DIAGNOSIS — S0181XA Laceration without foreign body of other part of head, initial encounter: Secondary | ICD-10-CM

## 2016-10-23 DIAGNOSIS — Y929 Unspecified place or not applicable: Secondary | ICD-10-CM | POA: Insufficient documentation

## 2016-10-23 DIAGNOSIS — F1010 Alcohol abuse, uncomplicated: Secondary | ICD-10-CM | POA: Insufficient documentation

## 2016-10-23 MED ORDER — TETANUS-DIPHTH-ACELL PERTUSSIS 5-2.5-18.5 LF-MCG/0.5 IM SUSP
0.5000 mL | Freq: Once | INTRAMUSCULAR | Status: AC
  Administered 2016-10-23: 0.5 mL via INTRAMUSCULAR
  Filled 2016-10-23: qty 0.5

## 2016-10-23 MED ORDER — LIDOCAINE-EPINEPHRINE (PF) 2 %-1:200000 IJ SOLN
20.0000 mL | Freq: Once | INTRAMUSCULAR | Status: AC
  Administered 2016-10-23: 20 mL via INTRADERMAL
  Filled 2016-10-23: qty 20
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
## 2016-10-23 NOTE — Progress Notes (Signed)
   10/23/16 2200  Clinical Encounter Type  Visited With Patient  Visit Type ED  Spiritual Encounters  Spiritual Needs Emotional  Stress Factors  Patient Stress Factors Health changes  Introduction to Pt. Contacted family.
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
## 2016-10-24 NOTE — Discharge Instructions (Signed)
Follow up in 3 days for suture removal.  Return for fever, chills, redness, discharge.  You may have a new herniated disc in in your neck.  If it continues to hurt you follow up with neurosurgery.

## 2016-10-24 NOTE — ED Notes (Signed)
Pt stable, ambulatory, states understanding of discharge instructions 

## 2016-11-03 ENCOUNTER — Emergency Department (HOSPITAL_COMMUNITY)
Admission: EM | Admit: 2016-11-03 | Discharge: 2016-11-03 | Disposition: A | Discharge status: Home / Self Care | Payer: Self-pay | Attending: Emergency Medicine | Admitting: Emergency Medicine

## 2016-11-03 ENCOUNTER — Encounter (HOSPITAL_COMMUNITY): Payer: Self-pay | Admitting: Emergency Medicine

## 2016-11-03 DIAGNOSIS — F17210 Nicotine dependence, cigarettes, uncomplicated: Secondary | ICD-10-CM | POA: Insufficient documentation

## 2016-11-03 DIAGNOSIS — Z4802 Encounter for removal of sutures: Secondary | ICD-10-CM | POA: Insufficient documentation

## 2016-11-03 NOTE — ED Triage Notes (Signed)
Pt. Stated, here for suture removal

## 2016-11-03 NOTE — ED Triage Notes (Signed)
Sutures above and below rt. eye

## 2016-11-03 NOTE — ED Notes (Signed)
ED Provider at bedside. 
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
